# Patient Record
Sex: Male | Born: 1980 | Race: White | Hispanic: No | Marital: Single | Smoking: Current every day smoker
Health system: Southern US, Community
[De-identification: ages and names within clinical notes are randomized; demographics above are authoritative.]

## PROBLEM LIST (undated history)

## (undated) DIAGNOSIS — J9383 Other pneumothorax: Secondary | ICD-10-CM

## (undated) DIAGNOSIS — I10 Essential (primary) hypertension: Secondary | ICD-10-CM

---

## 2004-07-07 ENCOUNTER — Emergency Department (HOSPITAL_COMMUNITY): Admission: EM | Admit: 2004-07-07 | Discharge: 2004-07-07 | Payer: Self-pay | Admitting: Emergency Medicine

## 2008-05-05 ENCOUNTER — Emergency Department (HOSPITAL_COMMUNITY): Admission: EM | Admit: 2008-05-05 | Discharge: 2008-05-06 | Payer: Self-pay | Admitting: Emergency Medicine

## 2008-10-16 ENCOUNTER — Emergency Department (HOSPITAL_COMMUNITY): Admission: EM | Admit: 2008-10-16 | Discharge: 2008-10-17 | Payer: Self-pay | Admitting: Emergency Medicine

## 2010-12-26 LAB — COMPREHENSIVE METABOLIC PANEL
ALT: 19 U/L (ref 0–53)
AST: 26 U/L (ref 0–37)
CO2: 25 mEq/L (ref 19–32)
Chloride: 104 mEq/L (ref 96–112)
GFR calc Af Amer: >60 mL/min (ref >60)
GFR calc non Af Amer: >60 mL/min (ref >60)
Glucose, Bld: 86 mg/dL (ref 70–99)
Sodium: 139 mEq/L (ref 135–145)
Total Bilirubin: 0.6 mg/dL (ref 0.3–1.2)

## 2010-12-26 LAB — DIFFERENTIAL
Basophils Absolute: 0.1 10*3/uL (ref 0.0–0.1)
Basophils Relative: 1 % (ref 0–1)
Eosinophils Absolute: 0.3 10*3/uL (ref 0.0–0.7)
Eosinophils Relative: 3 % (ref 0–5)

## 2010-12-26 LAB — CBC
Hemoglobin: 16.2 g/dL (ref 13.0–17.0)
MCV: 87.3 fL (ref 78.0–100.0)
RBC: 5.37 MIL/uL (ref 4.22–5.81)
WBC: 10.8 10*3/uL — ABNORMAL HIGH (ref 4.0–10.5)

## 2010-12-26 LAB — LIPASE, BLOOD: Lipase: 25 U/L (ref 11–59)

## 2017-05-07 ENCOUNTER — Emergency Department (HOSPITAL_COMMUNITY): Payer: Self-pay

## 2017-05-07 ENCOUNTER — Encounter (HOSPITAL_COMMUNITY): Payer: Self-pay | Admitting: Emergency Medicine

## 2017-05-07 ENCOUNTER — Observation Stay (HOSPITAL_COMMUNITY)
Admission: EM | Admit: 2017-05-07 | Discharge: 2017-05-08 | Disposition: A | Discharge status: Home / Self Care | Payer: Self-pay | Attending: Nephrology | Admitting: Nephrology

## 2017-05-07 ENCOUNTER — Other Ambulatory Visit: Payer: Self-pay

## 2017-05-07 ENCOUNTER — Other Ambulatory Visit (HOSPITAL_COMMUNITY): Payer: Self-pay

## 2017-05-07 DIAGNOSIS — F17210 Nicotine dependence, cigarettes, uncomplicated: Secondary | ICD-10-CM | POA: Insufficient documentation

## 2017-05-07 DIAGNOSIS — I10 Essential (primary) hypertension: Secondary | ICD-10-CM | POA: Insufficient documentation

## 2017-05-07 DIAGNOSIS — Z791 Long term (current) use of non-steroidal anti-inflammatories (NSAID): Secondary | ICD-10-CM | POA: Insufficient documentation

## 2017-05-07 DIAGNOSIS — F17200 Nicotine dependence, unspecified, uncomplicated: Secondary | ICD-10-CM | POA: Diagnosis present

## 2017-05-07 DIAGNOSIS — N179 Acute kidney failure, unspecified: Principal | ICD-10-CM | POA: Diagnosis present

## 2017-05-07 DIAGNOSIS — D751 Secondary polycythemia: Secondary | ICD-10-CM | POA: Insufficient documentation

## 2017-05-07 DIAGNOSIS — R079 Chest pain, unspecified: Secondary | ICD-10-CM | POA: Insufficient documentation

## 2017-05-07 DIAGNOSIS — E860 Dehydration: Secondary | ICD-10-CM | POA: Diagnosis present

## 2017-05-07 HISTORY — DX: Essential (primary) hypertension: I10

## 2017-05-07 HISTORY — DX: Other pneumothorax: J93.83

## 2017-05-07 LAB — CBC
HCT: 51.7 % (ref 39.0–52.0)
HEMOGLOBIN: 18.1 g/dL — AB (ref 13.0–17.0)
MCH: 29.3 pg (ref 26.0–34.0)
MCHC: 35 g/dL (ref 30.0–36.0)
MCV: 83.8 fL (ref 78.0–100.0)
PLATELETS: 292 10*3/uL (ref 150–400)
RBC: 6.17 MIL/uL — ABNORMAL HIGH (ref 4.22–5.81)
RDW: 13.9 % (ref 11.5–15.5)
WBC: 10.2 10*3/uL (ref 4.0–10.5)

## 2017-05-07 LAB — URINALYSIS, ROUTINE W REFLEX MICROSCOPIC
BILIRUBIN URINE: NEGATIVE
GLUCOSE, UA: NEGATIVE mg/dL
HGB URINE DIPSTICK: NEGATIVE
Ketones, ur: NEGATIVE mg/dL
Leukocytes, UA: NEGATIVE
NITRITE: NEGATIVE
PH: 5 (ref 5.0–8.0)
Protein, ur: 30 mg/dL — AB
SPECIFIC GRAVITY, URINE: 1.039 — AB (ref 1.005–1.030)

## 2017-05-07 LAB — BASIC METABOLIC PANEL
Anion gap: 16 — ABNORMAL HIGH (ref 5–15)
BUN: 27 mg/dL — AB (ref 6–20)
CHLORIDE: 97 mmol/L — AB (ref 101–111)
CO2: 24 mmol/L (ref 22–32)
CREATININE: 2.88 mg/dL — AB (ref 0.61–1.24)
Calcium: 10.4 mg/dL — ABNORMAL HIGH (ref 8.9–10.3)
GFR calc Af Amer: 31 mL/min — ABNORMAL LOW (ref >60)
GFR calc non Af Amer: 26 mL/min — ABNORMAL LOW (ref >60)
GLUCOSE: 102 mg/dL — AB (ref 65–99)
Potassium: 4.6 mmol/L (ref 3.5–5.1)
SODIUM: 137 mmol/L (ref 135–145)

## 2017-05-07 LAB — CK: Total CK: 388 U/L (ref 49–397)

## 2017-05-07 LAB — I-STAT TROPONIN, ED: TROPONIN I, POC: 0 ng/mL (ref 0.00–0.08)

## 2017-05-07 MED ORDER — ONDANSETRON HCL 4 MG/2ML IJ SOLN: 4.0000 mg | Freq: Four times a day (QID) | INTRAMUSCULAR | Status: DC | PRN

## 2017-05-07 MED ORDER — MORPHINE SULFATE (PF) 4 MG/ML IV SOLN
4.0000 mg | Freq: Once | INTRAVENOUS | Status: AC
  Administered 2017-05-07: 4 mg via INTRAVENOUS
  Filled 2017-05-07: qty 1

## 2017-05-07 MED ORDER — KETOROLAC TROMETHAMINE 30 MG/ML IJ SOLN
30.0000 mg | Freq: Once | INTRAMUSCULAR | Status: AC
  Administered 2017-05-07: 30 mg via INTRAVENOUS
  Filled 2017-05-07: qty 1

## 2017-05-07 MED ORDER — ENOXAPARIN SODIUM 30 MG/0.3ML ~~LOC~~ SOLN: 30.0000 mg | SUBCUTANEOUS | Status: DC

## 2017-05-07 MED ORDER — SODIUM CHLORIDE 0.45 % IV SOLN
INTRAVENOUS | Status: DC
  Administered 2017-05-08 (×2): via INTRAVENOUS

## 2017-05-07 MED ORDER — IOPAMIDOL (ISOVUE-370) INJECTION 76%
INTRAVENOUS | Status: AC
  Administered 2017-05-07: 100 mL
  Filled 2017-05-07: qty 100

## 2017-05-07 MED ORDER — ACETAMINOPHEN 325 MG PO TABS
650.0000 mg | ORAL_TABLET | Freq: Four times a day (QID) | ORAL | Status: DC | PRN
Start: 2017-05-07 — End: 2017-05-08

## 2017-05-07 MED ORDER — LIDOCAINE 5 % EX PTCH: 1.0000 | MEDICATED_PATCH | CUTANEOUS | Status: DC

## 2017-05-07 MED ORDER — SODIUM CHLORIDE 0.9 % IV BOLUS (SEPSIS)
1000.0000 mL | Freq: Once | INTRAVENOUS | Status: AC
  Administered 2017-05-07: 1000 mL via INTRAVENOUS

## 2017-05-07 MED ORDER — METHOCARBAMOL 500 MG PO TABS: 1000.0000 mg | ORAL_TABLET | Freq: Once | ORAL | Status: DC

## 2017-05-07 MED ORDER — SODIUM CHLORIDE 0.45 % IV BOLUS
1000.0000 mL | Freq: Once | INTRAVENOUS | Status: AC
  Administered 2017-05-07: 1000 mL via INTRAVENOUS

## 2017-05-07 NOTE — ED Notes (Signed)
Main lab to add on CK 

## 2017-05-07 NOTE — H&P (Signed)
History and Physical    [name]:[id number] DOB: Feb 03, 1981 DOA: 05/07/2017  PCP: Patient, No Pcp Per   Patient coming from: home.  I have personally briefly reviewed patient's old medical records in [name]  Chief Complaint: Chest pain and shortness of breath.  HPI: Tony Daniels is a 36 y.o. male with medical history significant of hypertension and is pontine and pneumothoraces coming to the emergency department with complaints of chest pain and progressively worse dyspnea since yesterday. He mentions that the symptoms are similar to when he had a pneumothorax in the past. He also complains of having body aches, fatigue and malaise similar to when he had a history in the past. 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 He mentions that he has had decreased urination for the past 2 days. He denies fever, chills, headache, sore throat, palpitations, dizziness, diaphoresis, lower extremity edema, PND or orthopnea. He denies nausea, emesis, diarrhea, constipation,melena or hematochezia. He denies dysuria, frequency or hematuria, but states that his urine volume has been decreased.  ED Course: Initial vital signs in the emergency department were temperature 97.5F, pulse 127, respirations 18, blood pressure 119/90 mmHg and O2 sat 99% on room air. He received 3 rounds of morphine 4 mg IVP, Toradol 30 mg IVP and 2000 mL of normal saline IV bolus.  Workup in the emergency department shows a urinalysis with increase a specific gravity and mild proteinuria at 30 mg/dL.WBC was 10.2, hemoglobin 18.1 g/dL and platelet is 578. His sodium 137, potassium 4.6, chloride 97and bicarbonate 24 mmol/L.BUN was 27, creatinine 2.88 (0.84 in February 2010) and glucose 102 mg/dL. His total CK was 388.  Imaging: no pulmonary embolus and no pneumothorax noticed. Small paraseptal emphysematous cysts on right apex.  Review of  Systems: As per HPI otherwise 10 point review of systems negative.    Past Medical History:  Diagnosis Date  . Hypertension   . Spontaneous pneumothorax     History reviewed. No pertinent surgical history.   reports that he has been smoking.  He has never used smokeless tobacco. He reports that he does not drink alcohol or use drugs.  Allergies  Allergen Reactions  . Tape Rash    No "plastic, clear tape"   Family History  Problem Relation Age of Onset  . Diabetes Mellitus II Mother   . Hypertension Mother   . Heart disease Paternal Uncle        Multiple paternal uncles with heart disease    Prior to Admission medications   Medication Sig Start Date End Date Taking? Authorizing Provider  ibuprofen (ADVIL,MOTRIN) 200 MG tablet Take 800 mg by mouth every 6 (six) hours as needed (for pain).   Yes [provider]    Physical Exam: Vitals:   05/07/17 2043 05/07/17 2100 05/07/17 2130 05/07/17 2200  BP: 106/77 126/82 127/78 120/83  Pulse: (!) 104 100 (!) 104 95  Resp: 20 12 15  (!) 28  Temp:      TempSrc:      SpO2: 98% 94% 97% 100%  Weight:      Height:        Constitutional: NAD, calm, comfortable Eyes: PERRL, lids and conjunctivae normal ENMT: Mucous membranes are moist. Posterior pharynx clear of any exudate or lesions.Normal dentition.  Neck: normal, supple, no masses, no thyromegaly Respiratory: clear to auscultation bilaterally, no wheezing, no crackles. Normal respiratory effort. No accessory muscle  use. Chest wall: Right lateral lower chest wall tenderness. Cardiovascular: Regular rate and rhythm, no murmurs / rubs / gallops. No extremity edema. 2+ pedal pulses. No carotid bruits.  Abdomen: no tenderness, no masses palpated. No hepatosplenomegaly. Bowel sounds positive.  Musculoskeletal: no clubbing / cyanosis.  Good ROM, no contractures. Normal muscle tone.  Skin: no rashes, lesions, ulcers on limited skin exam. Neurologic: CN 2-12 grossly intact.  Sensation intact, DTR normal. Strength 5/5 in all 4.  Psychiatric: Normal judgment and insight. Alert and oriented x 4. Normal mood.    Labs on Admission: I have personally reviewed following labs and imaging studies  CBC:  Recent Labs Lab 05/07/17 1813  WBC 10.2  HGB 18.1*  HCT 51.7  MCV 83.8  PLT 292   Basic Metabolic Panel:  Recent Labs Lab 05/07/17 1813  NA 137  K 4.6  CL 97*  CO2 24  GLUCOSE 102*  BUN 27*  CREATININE 2.88*  CALCIUM 10.4*   GFR: Estimated Creatinine Clearance: 33.8 mL/min (A) (by C-G formula based on SCr of 2.88 mg/dL (H)). Liver Function Tests: No results for input(s): AST, ALT, ALKPHOS, BILITOT, PROT, ALBUMIN in the last 168 hours. No results for input(s): LIPASE, AMYLASE in the last 168 hours. No results for input(s): AMMONIA in the last 168 hours. Coagulation Profile: No results for input(s): INR, PROTIME in the last 168 hours. Cardiac Enzymes:  Recent Labs Lab 05/07/17 1813  CKTOTAL 388   BNP (last 3 results) No results for input(s): PROBNP in the last 8760 hours. HbA1C: No results for input(s): HGBA1C in the last 72 hours. CBG: No results for input(s): GLUCAP in the last 168 hours. Lipid Profile: No results for input(s): CHOL, HDL, LDLCALC, TRIG, CHOLHDL, LDLDIRECT in the last 72 hours. Thyroid Function Tests: No results for input(s): TSH, T4TOTAL, FREET4, T3FREE, THYROIDAB in the last 72 hours. Anemia Panel: No results for input(s): VITAMINB12, FOLATE, FERRITIN, TIBC, IRON, RETICCTPCT in the last 72 hours. Urine analysis:    Component Value Date/Time   COLORURINE AMBER (A) 05/07/2017 2011   APPEARANCEUR HAZY (A) 05/07/2017 2011   LABSPEC 1.039 (H) 05/07/2017 2011   PHURINE 5.0 05/07/2017 2011   GLUCOSEU NEGATIVE 05/07/2017 2011   HGBUR NEGATIVE 05/07/2017 2011   BILIRUBINUR NEGATIVE 05/07/2017 2011   KETONESUR NEGATIVE 05/07/2017 2011   PROTEINUR 30 (A) 05/07/2017 2011   NITRITE NEGATIVE 05/07/2017 2011   LEUKOCYTESUR  NEGATIVE 05/07/2017 2011    Radiological Exams on Admission: Dg Chest 2 View  Result Date: 05/07/2017 CLINICAL DATA:  36 year old male with right side chest pain and shortness of breath since yesterday. Decreased breath sounds on the right. Smoker. EXAM: CHEST  2 VIEW COMPARISON:  08/23/2016 and earlier. FINDINGS: Stable lung volumes at the upper limits of normal to hyperinflated. Mediastinal contours remain normal. Visualized tracheal air column is within normal limits. No pneumothorax, pulmonary edema, pleural effusion or confluent pulmonary opacity. Stable visualized osseous structures. Chronic mild increased interstitial markings. Negative visible bowel gas pattern. IMPRESSION: No pneumothorax or acute cardiopulmonary abnormality. Electronically Signed   By: Odessa Fleming M.D.   On: 05/07/2017 17:57   Ct Angio Chest Pe W And/or Wo Contrast  Result Date: 05/07/2017 CLINICAL DATA:  Pt c/o severe CP x 2 days.H/O spontaneous pneumothorax EXAM: CT ANGIOGRAPHY CHEST WITH CONTRAST TECHNIQUE: Multidetector CT imaging of the chest was performed using the standard protocol during bolus administration of intravenous contrast. Multiplanar CT image reconstructions and MIPs were obtained to evaluate the vascular anatomy. CONTRAST:  100  mL of Isovue 370 intravenous contrast COMPARISON:  Current chest radiographs FINDINGS: Cardiovascular: Satisfactory opacification of the pulmonary arteries to the segmental level. No evidence of pulmonary embolism. Normal heart size. No pericardial effusion. No visible coronary artery calcifications. Aorta is not opacified. No aneurysm. Mediastinum/Nodes: No enlarged mediastinal, hilar, or axillary lymph nodes. Thyroid gland, trachea, and esophagus demonstrate no significant findings. Lungs/Pleura: Small anastomosis staple line lies along the anteromedial right lung apex. There is an adjacent paraseptal emphysematous cysts. There is no pneumothorax. Single nodule lies in the right upper  lobe, image 72, series 9, measuring 6 x 4 mm, average 5 mm. No other lung nodules. Lungs are otherwise clear. No pleural effusion. Upper Abdomen: Unremarkable. Musculoskeletal: No chest wall abnormality. No acute or significant osseous findings. Review of the MIP images confirms the above findings. IMPRESSION: 1. No evidence of a pulmonary embolism. 2. No acute findings or findings to account for chest pain. No pneumothorax. 3. Small paraseptal emphysematous cysts at the right apex adjacent to a pulmonary anastomosis staple line. 4. 6 x 4 mm, mean 5 mm, nodule in the right upper lobe. No follow-up needed if patient is low-risk. Non-contrast chest CT can be considered in 12 months if patient is high-risk. This recommendation follows the consensus statement: Guidelines for Management of Incidental Pulmonary Nodules Detected on CT Images: From the Fleischner Society 2017; Radiology 2017; 284:228-243. Emphysema (ICD10-J43.9). Electronically Signed   By: Amie Portland M.D.   On: 05/07/2017 18:55    EKG: Independently reviewed. Vent. rate 106 BPM PR interval * ms QRS duration 134 ms QT/QTc 368/489 ms P-R-T axes 77 139 20 Sinus tachycardia Biatrial enlargement RBBB and LPFB ST elev, probable normal early repol pattern  Assessment/Plan Principal Problem:   AKI (acute kidney injury) (HCC) Secondary to dehydration. Place in observation/telemetry. Continue IV fluids. Follow-up renal function and electrolytes in the morning. Check urine creatinine and electrolytes. Check renal ultrasound in the morning. Consider renal evaluation if no improvement.  Active Problems:   Chest pain Continue cardiac monitoring. Analgesics as needed. Trend troponin levels. Check echocardiogram in a.m.    Essential hypertension Not on medical therapy at this time. Will use as needed IV metoprolol. Monitor blood pressure.    Polycythemia Likely due to dehydration. Continue IV fluids. Follow-up hemoglobin level in  the morning.    Dehydration Continue IV fluids. Monitor intake and output. Follow-up BMP in the morning.    Tobacco use disorder Nicotine replacement therapy ordered. Tobacco cessation information/education to be provided. Advised the patient to cease smoking given signs of emphysema on imaging.    DVT prophylaxis: Lovenox SQ. Code Status: full code. Family Communication:  Disposition Plan: admit for IV hydration, cardiac monitoring, troponin levels trending, renal US and echo. Consults called:  Admission status: observation/telemetry.   Bobette Mo MD [facility] Pager [phone].  If 7PM-7AM, please contact night-coverage [email or web address] Password [facility]  05/07/2017, 10:54 PM

## 2017-05-07 NOTE — ED Provider Notes (Signed)
[facility]-EMERGENCY DEPT Provider Note   CSN: [id number] Arrival date & time: 05/07/17  1736     History   Chief Complaint Chief Complaint  Patient presents with  . Chest Pain    HPI Tony Daniels is a 36 y.o. male.  HPI  35 y.o. male with a hx of Spontaneous Pneumothorax, presents to the Emergency Department today due to CP with associated shortness of breath. Notes onset yesterday while working outside. States gradually worsening pain this morning. Pt clutching right side of chest. Pt states that this is where his last pneumothorax was in the past. Seen at [facility] for this. Denies N/V. No fevers. No cough.congestion. No diaphoresis. No abdominal pain. No numbness/tingling. No weakness. Rates pain 10/10 and isolated to right lateral rib cage. Sharp sensation. Worse with breathing. No meds PTA. No other symptoms noted.     Past Medical History:  Diagnosis Date  . Spontaneous pneumothorax     There are no active problems to display for this patient.   History reviewed. No pertinent surgical history.     Home Medications    Prior to Admission medications   Not on File    Family History History reviewed. No pertinent family history.  Social History Social History  Substance Use Topics  . Smoking status: Current Every Day Smoker  . Smokeless tobacco: Never Used  . Alcohol use No     Allergies   Patient has no known allergies.   Review of Systems Review of Systems ROS reviewed and all are negative for acute change except as noted in the HPI.  Physical Exam Updated Vital Signs BP 119/90 (BP Location: Right Arm)   Pulse (!) 127   Temp 97.8 F (36.6 C) (Oral)   Resp 18   SpO2 99%   Physical Exam  Constitutional: He is oriented to person, place, and time. He appears well-developed and well-nourished. No distress.  Pt clutching right lateral rib cage  HENT:  Head: Normocephalic and atraumatic.  Right Ear: Tympanic membrane, external ear and ear canal  normal.  Left Ear: Tympanic membrane, external ear and ear canal normal.  Nose: Nose normal.  Mouth/Throat: Uvula is midline, oropharynx is clear and moist and mucous membranes are normal. No trismus in the jaw. No oropharyngeal exudate, posterior oropharyngeal erythema or tonsillar abscesses.  Eyes: Pupils are equal, round, and reactive to light. EOM are normal.  Neck: Normal range of motion. Neck supple. No tracheal deviation present.  Cardiovascular: Regular rhythm, S1 normal, S2 normal, normal heart sounds, intact distal pulses and normal pulses.  Tachycardia present.   Pulmonary/Chest: Effort normal and breath sounds normal. No respiratory distress. He has no decreased breath sounds. He has no wheezes. He has no rhonchi. He has no rales.  Right lateral chest wall TTP. No palpable or visible deformities.   Abdominal: Normal appearance and bowel sounds are normal. There is no tenderness.  Musculoskeletal: Normal range of motion.  Neurological: He is alert and oriented to person, place, and time.  Skin: Skin is warm and dry.  Psychiatric: He has a normal mood and affect. His speech is normal and behavior is normal. Thought content normal.     ED Treatments / Results  Labs (all labs ordered are listed, but only abnormal results are displayed) Labs Reviewed  CBC - Abnormal; Notable for the following:       Result Value   RBC 6.17 (*)    Hemoglobin 18.1 (*)    All other components within  normal limits  BASIC METABOLIC PANEL - Abnormal; Notable for the following:    Chloride 97 (*)    Glucose, Bld 102 (*)    BUN 27 (*)    Creatinine, Ser 2.88 (*)    Calcium 10.4 (*)    GFR calc non Af Amer 26 (*)    GFR calc Af Amer 31 (*)    Anion gap 16 (*)    All other components within normal limits  URINALYSIS, ROUTINE W REFLEX MICROSCOPIC - Abnormal; Notable for the following:    Color, Urine AMBER (*)    APPearance HAZY (*)    Specific Gravity, Urine 1.039 (*)    Protein, ur 30 (*)     Bacteria, UA RARE (*)    Squamous Epithelial / LPF 0-5 (*)    All other components within normal limits  CK  I-STAT TROPONIN, ED    EKG  EKG Interpretation None       Radiology Dg Chest 2 View  Result Date: 05/07/2017 CLINICAL DATA:  36 year old male with right side chest pain and shortness of breath since yesterday. Decreased breath sounds on the right. Smoker. EXAM: CHEST  2 VIEW COMPARISON:  08/23/2016 and earlier. FINDINGS: Stable lung volumes at the upper limits of normal to hyperinflated. Mediastinal contours remain normal. Visualized tracheal air column is within normal limits. No pneumothorax, pulmonary edema, pleural effusion or confluent pulmonary opacity. Stable visualized osseous structures. Chronic mild increased interstitial markings. Negative visible bowel gas pattern. IMPRESSION: No pneumothorax or acute cardiopulmonary abnormality. Electronically Signed   By: Odessa Fleming M.D.   On: 05/07/2017 17:57   Ct Angio Chest Pe W And/or Wo Contrast  Result Date: 05/07/2017 CLINICAL DATA:  Pt c/o severe CP x 2 days.H/O spontaneous pneumothorax EXAM: CT ANGIOGRAPHY CHEST WITH CONTRAST TECHNIQUE: Multidetector CT imaging of the chest was performed using the standard protocol during bolus administration of intravenous contrast. Multiplanar CT image reconstructions and MIPs were obtained to evaluate the vascular anatomy. CONTRAST:  100 mL of Isovue 370 intravenous contrast COMPARISON:  Current chest radiographs FINDINGS: Cardiovascular: Satisfactory opacification of the pulmonary arteries to the segmental level. No evidence of pulmonary embolism. Normal heart size. No pericardial effusion. No visible coronary artery calcifications. Aorta is not opacified. No aneurysm. Mediastinum/Nodes: No enlarged mediastinal, hilar, or axillary lymph nodes. Thyroid gland, trachea, and esophagus demonstrate no significant findings. Lungs/Pleura: Small anastomosis staple line lies along the anteromedial right lung  apex. There is an adjacent paraseptal emphysematous cysts. There is no pneumothorax. Single nodule lies in the right upper lobe, image 72, series 9, measuring 6 x 4 mm, average 5 mm. No other lung nodules. Lungs are otherwise clear. No pleural effusion. Upper Abdomen: Unremarkable. Musculoskeletal: No chest wall abnormality. No acute or significant osseous findings. Review of the MIP images confirms the above findings. IMPRESSION: 1. No evidence of a pulmonary embolism. 2. No acute findings or findings to account for chest pain. No pneumothorax. 3. Small paraseptal emphysematous cysts at the right apex adjacent to a pulmonary anastomosis staple line. 4. 6 x 4 mm, mean 5 mm, nodule in the right upper lobe. No follow-up needed if patient is low-risk. Non-contrast chest CT can be considered in 12 months if patient is high-risk. This recommendation follows the consensus statement: Guidelines for Management of Incidental Pulmonary Nodules Detected on CT Images: From the Fleischner Society 2017; Radiology 2017; 284:228-243. Emphysema (ICD10-J43.9). Electronically Signed   By: Amie Portland M.D.   On: 05/07/2017 18:55    Procedures  Procedures (including critical care time)  Medications Ordered in ED Medications  sodium chloride 0.9 % bolus 1,000 mL (not administered)  sodium chloride 0.9 % bolus 1,000 mL (not administered)  morphine 4 MG/ML injection 4 mg (not administered)  morphine 4 MG/ML injection 4 mg (4 mg Intravenous Given 05/07/17 1817)  ketorolac (TORADOL) 30 MG/ML injection 30 mg (30 mg Intravenous Given 05/07/17 1817)  iopamidol (ISOVUE-370) 76 % injection (100 mLs  Contrast Given 05/07/17 1831)     Initial Impression / Assessment and Plan / ED Course  I have reviewed the triage vital signs and the nursing notes.  Pertinent labs & imaging results that were available during my care of the patient were reviewed by me and considered in my medical decision making (see chart for details).  Final  Clinical Impressions(s) / ED Diagnoses  {I have reviewed and evaluated the relevant laboratory values. {I have reviewed and evaluated the relevant imaging studies. {I have interpreted the relevant EKG. {I have reviewed the relevant previous healthcare records.  {I obtained HPI from historian. {Patient discussed with supervising physician.  ED Course:  Assessment: Pt is a 36 y.o. male with a hx of Spontaneous Pneumothorax, presents to the Emergency Department today due to CP with associated shortness of breath. Notes onset yesterday while working outside. States gradually worsening pain this morning. Pt clutching right side of chest. Pt states that this is where his last pneumothorax was in the past. Seen at [facility] for this. Denies N/V. No fevers. No cough.congestion. No diaphoresis. No abdominal pain. No numbness/tingling. No weakness. Rates pain 10/10 and isolated to right lateral rib cage. Sharp sensation. Worse with breathing. No meds PTA. On exam, pt in NAD. Nontoxic/nonseptic appearing. VS with tachycardia. Afebrile. Lungs CTA. Heart RRR. Abdomen nontender soft. CXR with pneumothorax. No acute abnormality EKG unremarkable. Trop negative. Concern for PE vs small pneumo. CT Angio ordered, which negative. Given analgesia in ED.   7:21 PM- BMP with elevated creatinine at 2.88. No previous to compare. BUN elevated as well as decrease in GFR. Dehydration causing AKI? Rhabdo possibility. UA ordered as well as CK. Given analgesia in ED as well as fluids 2L fluids. CT was completed prior to result of creatinine.  9:50 PM- UA unremarkable. CK unremarkable. Plan is to Admit due to AKI.   Disposition/Plan:  Admit Pt acknowledges and agrees with plan  Supervising Physician Arby Barrette, MD  Final diagnoses:  AKI (acute kidney injury) [facility])    New Prescriptions New Prescriptions   No medications on file     Wilber Bihari 05/07/17 2158    Arby Barrette, MD 05/08/17 0100

## 2017-05-07 NOTE — ED Triage Notes (Signed)
Pt c/o right sided CP with some SOB; pt sts hx of spontaneous pneumothorax; lung sounds diminished

## 2017-05-08 ENCOUNTER — Observation Stay (HOSPITAL_BASED_OUTPATIENT_CLINIC_OR_DEPARTMENT_OTHER): Payer: Self-pay

## 2017-05-08 ENCOUNTER — Observation Stay (HOSPITAL_COMMUNITY): Payer: Self-pay

## 2017-05-08 ENCOUNTER — Encounter (HOSPITAL_COMMUNITY): Payer: Self-pay | Admitting: Internal Medicine

## 2017-05-08 DIAGNOSIS — F17200 Nicotine dependence, unspecified, uncomplicated: Secondary | ICD-10-CM | POA: Diagnosis present

## 2017-05-08 DIAGNOSIS — I361 Nonrheumatic tricuspid (valve) insufficiency: Secondary | ICD-10-CM

## 2017-05-08 DIAGNOSIS — E860 Dehydration: Secondary | ICD-10-CM

## 2017-05-08 DIAGNOSIS — N179 Acute kidney failure, unspecified: Secondary | ICD-10-CM

## 2017-05-08 DIAGNOSIS — R079 Chest pain, unspecified: Secondary | ICD-10-CM

## 2017-05-08 LAB — ECHOCARDIOGRAM COMPLETE
Height: 70 in
Weight: 2272 oz

## 2017-05-08 LAB — BASIC METABOLIC PANEL
ANION GAP: 4 — AB (ref 5–15)
BUN: 19 mg/dL (ref 6–20)
CALCIUM: 8.2 mg/dL — AB (ref 8.9–10.3)
CO2: 26 mmol/L (ref 22–32)
Chloride: 107 mmol/L (ref 101–111)
Creatinine, Ser: 1.1 mg/dL (ref 0.61–1.24)
GFR calc Af Amer: >60 mL/min (ref >60)
GFR calc non Af Amer: >60 mL/min (ref >60)
GLUCOSE: 85 mg/dL (ref 65–99)
Potassium: 3.9 mmol/L (ref 3.5–5.1)
Sodium: 137 mmol/L (ref 135–145)

## 2017-05-08 LAB — CBC
HEMATOCRIT: 39.4 % (ref 39.0–52.0)
HEMOGLOBIN: 13.2 g/dL (ref 13.0–17.0)
MCH: 28.3 pg (ref 26.0–34.0)
MCHC: 33.8 g/dL (ref 30.0–36.0)
MCV: 83.8 fL (ref 78.0–100.0)
Platelets: 225 10*3/uL (ref 150–400)
RBC: 4.7 MIL/uL (ref 4.22–5.81)
RDW: 14 % (ref 11.5–15.5)
WBC: 8.4 10*3/uL (ref 4.0–10.5)

## 2017-05-08 LAB — TROPONIN I
Troponin I: <0.03 ng/mL (ref <0.03)
Troponin I: <0.03 ng/mL (ref <0.03)

## 2017-05-08 MED ORDER — HYDROCODONE-ACETAMINOPHEN 5-325 MG PO TABS
1.0000 | ORAL_TABLET | ORAL | Status: DC | PRN
  Filled 2017-05-08: qty 2

## 2017-05-08 MED ORDER — HYDROXYZINE HCL 25 MG PO TABS
50.0000 mg | ORAL_TABLET | Freq: Every evening | ORAL | Status: DC | PRN
  Administered 2017-05-08: 50 mg via ORAL
  Filled 2017-05-08: qty 2

## 2017-05-08 MED ORDER — ZOLPIDEM TARTRATE 5 MG PO TABS
5.0000 mg | ORAL_TABLET | Freq: Every evening | ORAL | Status: DC | PRN
  Filled 2017-05-08: qty 1

## 2017-05-08 MED ORDER — MORPHINE SULFATE (PF) 4 MG/ML IV SOLN
4.0000 mg | Freq: Once | INTRAVENOUS | Status: AC
  Administered 2017-05-08: 4 mg via INTRAVENOUS
  Filled 2017-05-08: qty 1

## 2017-05-08 MED ORDER — METHOCARBAMOL 500 MG PO TABS
500.0000 mg | ORAL_TABLET | Freq: Three times a day (TID) | ORAL | Status: DC
  Administered 2017-05-08 (×2): 500 mg via ORAL
  Filled 2017-05-08 (×2): qty 1

## 2017-05-08 MED ORDER — METHOCARBAMOL 500 MG PO TABS: 500.0000 mg | ORAL_TABLET | Freq: Three times a day (TID) | ORAL | 0 refills | Status: AC | PRN

## 2017-05-08 MED ORDER — ACETAMINOPHEN 325 MG PO TABS: 650.0000 mg | ORAL_TABLET | Freq: Four times a day (QID) | ORAL | Status: AC | PRN

## 2017-05-08 MED ORDER — NICOTINE 14 MG/24HR TD PT24
14.0000 mg | MEDICATED_PATCH | Freq: Every day | TRANSDERMAL | Status: DC
  Administered 2017-05-08 (×2): 14 mg via TRANSDERMAL
  Filled 2017-05-08 (×2): qty 1

## 2017-05-08 MED ORDER — OXYCODONE HCL 5 MG PO TABS
5.0000 mg | ORAL_TABLET | ORAL | Status: DC | PRN
  Administered 2017-05-08 (×4): 5 mg via ORAL
  Filled 2017-05-08 (×4): qty 1

## 2017-05-08 MED ORDER — NICOTINE 14 MG/24HR TD PT24: 14.0000 mg | MEDICATED_PATCH | Freq: Every day | TRANSDERMAL | Status: DC

## 2017-05-08 MED ORDER — HYDROCODONE-ACETAMINOPHEN 5-325 MG PO TABS
1.0000 | ORAL_TABLET | Freq: Once | ORAL | Status: AC
  Administered 2017-05-08: 2 via ORAL
  Filled 2017-05-08: qty 2

## 2017-05-08 NOTE — Progress Notes (Signed)
Financial Counselor to see patient to determine what he will qualify for - pt listed as self pay. Abelino DerrickB Mykael Trott [facility] IncRN,MHA,BSN [phone]

## 2017-05-08 NOTE — Progress Notes (Signed)
  Echocardiogram 2D Echocardiogram has been performed.  Tony Daniels 05/08/2017, 9:35 AM

## 2017-05-08 NOTE — Progress Notes (Signed)
Pt has orders to be discharged. Discharge instructions given and pt has no additional questions at this time. Medication regimen reviewed and pt educated. Pt verbalized understanding and has no additional questions. Telemetry box removed. IV removed and site in good condition. Pt stable and waiting for transportation. 

## 2017-05-08 NOTE — Discharge Summary (Signed)
Physician Discharge Summary  Tony Daniels ZOX:[id number] DOB: 06-18-1981 DOA: 05/07/2017  PCP: Patient, No Pcp Per  Admit date: 05/07/2017 Discharge date: 05/08/2017  Admitted From:home Disposition:home  Recommendations for Outpatient Follow-up:  1. Follow up with PCP in 1-2 weeks 2. Please obtain BMP/CBC in one week   Home Health:no Equipment/Devices:no Discharge Condition:stable CODE STATUS:full code Diet recommendation:heart healthy  Brief/Interim Summary: 36 year old male with history of hypertension, spontaneous pneumothoraxes presented with chest pain or shortness of breath. Chest x-ray was unremarkable. CT angiogram was negative for PE. Patient was found to have right upper lobe lung nodule. No bony abnormalities. Troponin negative. Echocardiogram unremarkable. Patient's chest pain most likely musculoskeletal related causing muscle spasm. He started on Robaxin and recommended Tylenol as needed for the pain management. Patient also found to have hemoconcentration and neck acute kidney injury likely dehydration. Treated with IV fluid with improvement in serum creatinine level and blood counts. Patient was recommended to continue muscle relaxant and Tylenol for the pain management. Recommended to follow up with PCP. Also recommended to follow up with PCP for the evaluation of lung nodule. He verbalized understanding. He has no headache, dizziness, nausea vomiting, shortness of breath. I think patient is medically stable to transition his care to outpatient. Most of the workup are unremarkable.  Discharge Diagnoses:  Principal Problem:   AKI (acute kidney injury) (HCC) Active Problems:   Chest pain   Essential hypertension   Polycythemia   Dehydration   Tobacco use disorder    Discharge Instructions  Discharge Instructions    Call MD for:  difficulty breathing, headache or visual disturbances    Complete by:  As directed    Call MD for:  extreme fatigue    Complete by:  As  directed    Call MD for:  hives    Complete by:  As directed    Call MD for:  persistant dizziness or light-headedness    Complete by:  As directed    Call MD for:  persistant nausea and vomiting    Complete by:  As directed    Call MD for:  severe uncontrolled pain    Complete by:  As directed    Call MD for:  temperature >100.4    Complete by:  As directed    Diet - low sodium heart healthy    Complete by:  As directed    Increase activity slowly    Complete by:  As directed    Increase activity slowly    Complete by:  As directed    You have 6 x 4 mm, mean 5 mm, nodule in the upper lobe of right lung. Non-contrast chest CT can be considered in 12 months, please follow with your PCP.     Allergies as of 05/08/2017      Reactions   Tape Rash   No "plastic, clear tape"      Medication List    STOP taking these medications   ibuprofen 200 MG tablet Commonly known as:  ADVIL,MOTRIN     TAKE these medications   acetaminophen 325 MG tablet Commonly known as:  TYLENOL Take 2 tablets (650 mg total) by mouth every 6 (six) hours as needed for mild pain or headache.   methocarbamol 500 MG tablet Commonly known as:  ROBAXIN Take 1 tablet (500 mg total) by mouth every 8 (eight) hours as needed for muscle spasms.            Discharge Care Instructions  Start     Ordered   05/08/17 0000  acetaminophen (TYLENOL) 325 MG tablet  Every 6 hours PRN     05/08/17 1203   05/08/17 0000  methocarbamol (ROBAXIN) 500 MG tablet  Every 8 hours PRN     05/08/17 1203   05/08/17 0000  Increase activity slowly     05/08/17 1203   05/08/17 0000  Diet - low sodium heart healthy     05/08/17 1203   05/08/17 0000  Call MD for:  temperature >100.4     05/08/17 1203   05/08/17 0000  Call MD for:  persistant nausea and vomiting     05/08/17 1203   05/08/17 0000  Call MD for:  severe uncontrolled pain     05/08/17 1203   05/08/17 0000  Call MD for:  difficulty breathing, headache or  visual disturbances     05/08/17 1203   05/08/17 0000  Call MD for:  hives     05/08/17 1203   05/08/17 0000  Call MD for:  persistant dizziness or light-headedness     05/08/17 1203   05/08/17 0000  Call MD for:  extreme fatigue     05/08/17 1203   05/08/17 0000  Increase activity slowly    Comments:  You have 6 x 4 mm, mean 5 mm, nodule in the upper lobe of right lung. Non-contrast chest CT can be considered in 12 months, please follow with your PCP.   05/08/17 1205     Follow-up Information    [facility]. Schedule an appointment as soon as possible for a visit in 2 week(s).   Contact information: [address] [place] [address] [phone]         Allergies  Allergen Reactions  . Tape Rash    No "plastic, clear tape"    Consultations: None  Procedures/Studies: Echo  Subjective: Seen and examined at bedside. Reports mild right-sided pain for which is started Robaxin and Tylenol as needed. Denied headache, dizziness, nausea vomiting.  Discharge Exam: Vitals:   05/08/17 0744 05/08/17 1146  BP:  (!) 109/54  Pulse: 71 (!) 52  Resp:  20  Temp:  97.8 F (36.6 C)  SpO2:  97%   Vitals:   05/08/17 0533 05/08/17 0611 05/08/17 0744 05/08/17 1146  BP:  (!) 100/53  (!) 109/54  Pulse: (!) 41 (!) 53 71 (!) 52  Resp:  20  20  Temp:  97.7 F (36.5 C)  97.8 F (36.6 C)  TempSrc:  Oral  Oral  SpO2:  100%  97%  Weight:      Height:        General: Pt is alert, awake, not in acute distress Cardiovascular: RRR, S1/S2 +, no rubs, no gallops Respiratory: CTA bilaterally, no wheezing, no rhonchi. No bruises, rashes or injury on the right side of chest Abdominal: Soft, NT, ND, bowel sounds + Extremities: no edema, no cyanosis    The results of significant diagnostics from this hospitalization (including imaging, microbiology, ancillary and laboratory) are listed below for reference.     Microbiology: No results  found for this or any previous visit (from the past 240 hour(s)).   Labs: BNP (last 3 results) No results for input(s): BNP in the last 8760 hours. Basic Metabolic Panel:  Recent Labs Lab 05/07/17 1813 05/08/17 0955  NA 137 137  K 4.6 3.9  CL 97* 107  CO2 24 26  GLUCOSE 102* 85  BUN 27* 19  CREATININE 2.88* 1.10  CALCIUM 10.4* 8.2*   Liver Function Tests: No results for input(s): AST, ALT, ALKPHOS, BILITOT, PROT, ALBUMIN in the last 168 hours. No results for input(s): LIPASE, AMYLASE in the last 168 hours. No results for input(s): AMMONIA in the last 168 hours. CBC:  Recent Labs Lab 05/07/17 1813 05/08/17 0642  WBC 10.2 8.4  HGB 18.1* 13.2  HCT 51.7 39.4  MCV 83.8 83.8  PLT 292 225   Cardiac Enzymes:  Recent Labs Lab 05/07/17 1813 05/08/17 0044 05/08/17 0955  CKTOTAL 388  --   --   TROPONINI  --  <0.03 <0.03   BNP: Invalid input(s): POCBNP CBG: No results for input(s): GLUCAP in the last 168 hours. D-Dimer No results for input(s): DDIMER in the last 72 hours. Hgb A1c No results for input(s): HGBA1C in the last 72 hours. Lipid Profile No results for input(s): CHOL, HDL, LDLCALC, TRIG, CHOLHDL, LDLDIRECT in the last 72 hours. Thyroid function studies No results for input(s): TSH, T4TOTAL, T3FREE, THYROIDAB in the last 72 hours.  Invalid input(s): FREET3 Anemia work up No results for input(s): VITAMINB12, FOLATE, FERRITIN, TIBC, IRON, RETICCTPCT in the last 72 hours. Urinalysis    Component Value Date/Time   COLORURINE AMBER (A) 05/07/2017 2011   APPEARANCEUR HAZY (A) 05/07/2017 2011   LABSPEC 1.039 (H) 05/07/2017 2011   PHURINE 5.0 05/07/2017 2011   GLUCOSEU NEGATIVE 05/07/2017 2011   HGBUR NEGATIVE 05/07/2017 2011   BILIRUBINUR NEGATIVE 05/07/2017 2011   KETONESUR NEGATIVE 05/07/2017 2011   PROTEINUR 30 (A) 05/07/2017 2011   NITRITE NEGATIVE 05/07/2017 2011   LEUKOCYTESUR NEGATIVE 05/07/2017 2011   Sepsis Labs Invalid input(s):  PROCALCITONIN,  WBC,  LACTICIDVEN Microbiology No results found for this or any previous visit (from the past 240 hour(s)).   Time coordinating discharge: 28 minutes  SIGNED:   Maxie Barb, MD  [facility] 05/08/2017, 12:06 PM  If 7PM-7AM, please contact night-coverage [email or web address] Password [email or web address]

## 2017-06-28 ENCOUNTER — Emergency Department (HOSPITAL_COMMUNITY): Admission: EM | Admit: 2017-06-28 | Discharge: 2017-06-28 | Discharge status: Against Medical Advice | Payer: Self-pay

## 2017-06-28 NOTE — ED Notes (Signed)
Called pt. X1  Pt. Is not in the waiting area.

## 2017-06-28 NOTE — ED Notes (Signed)
Called pt. X2 in waiting area, no answer

## 2018-08-13 DIAGNOSIS — E872 Acidosis: Secondary | ICD-10-CM

## 2018-08-13 DIAGNOSIS — J449 Chronic obstructive pulmonary disease, unspecified: Secondary | ICD-10-CM

## 2018-08-13 DIAGNOSIS — A419 Sepsis, unspecified organism: Secondary | ICD-10-CM

## 2018-08-13 DIAGNOSIS — J189 Pneumonia, unspecified organism: Secondary | ICD-10-CM

## 2019-02-08 IMAGING — US US RENAL
1 series · 14 of 25 positions shown · non-contrast
Comparison: CT 10/16/2008.

CLINICAL DATA: Acute renal insufficiency.

EXAM:
RENAL / URINARY TRACT ULTRASOUND COMPLETE

[Series 1: us renal · 0.23mm/px · 14 of 36 slices shown]
[im 1/36]
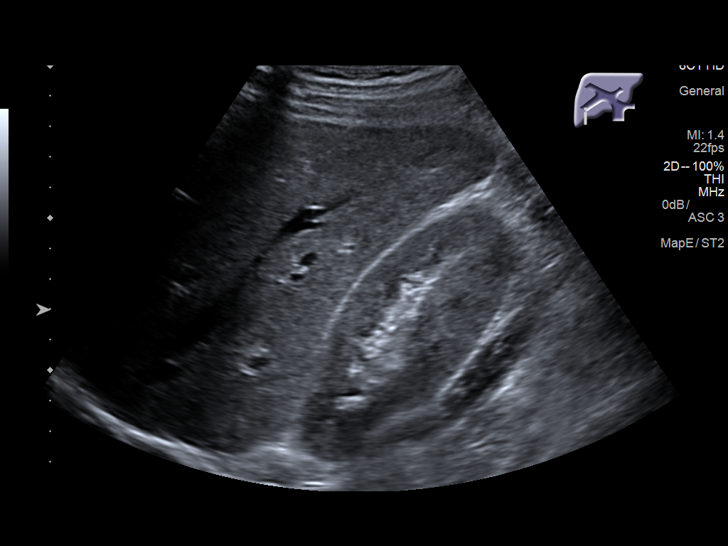
[im 3/36]
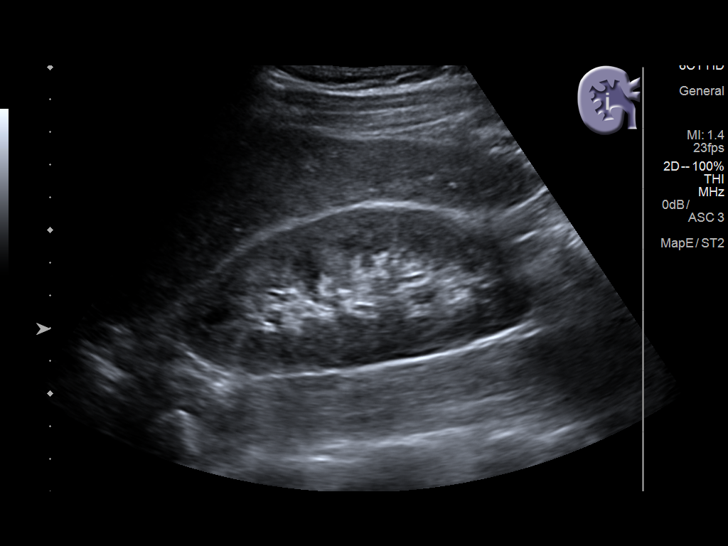
[im 6/36]
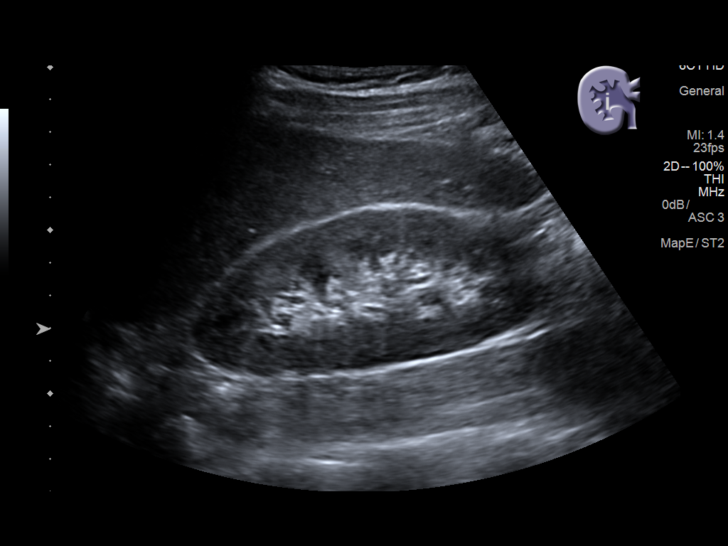
[im 9/36]
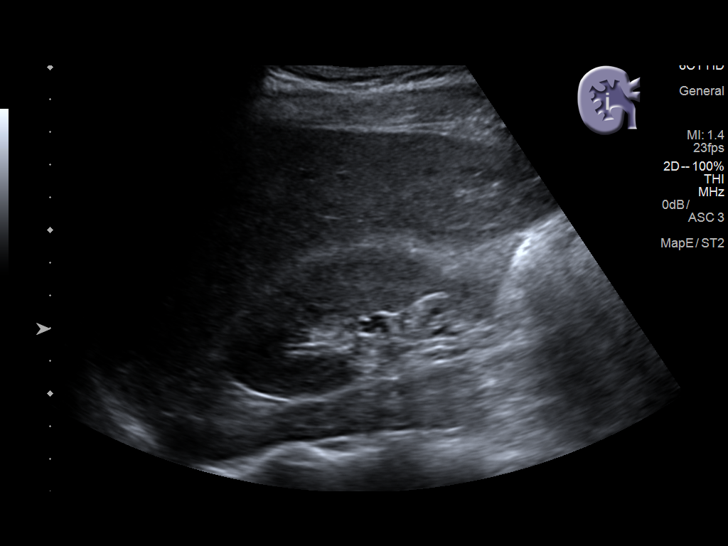
[im 12/36]
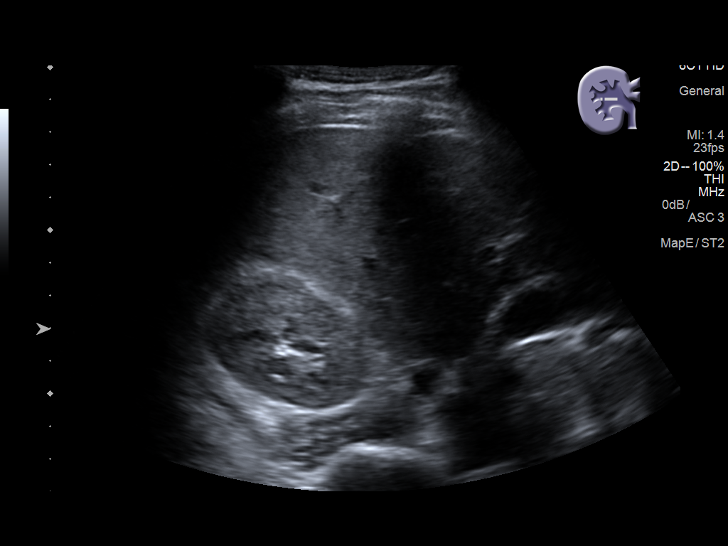
[im 14/36]
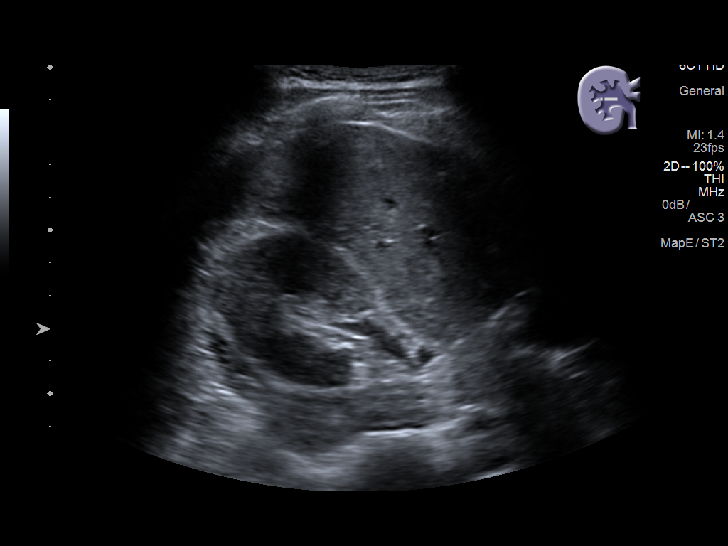
[im 17/36]
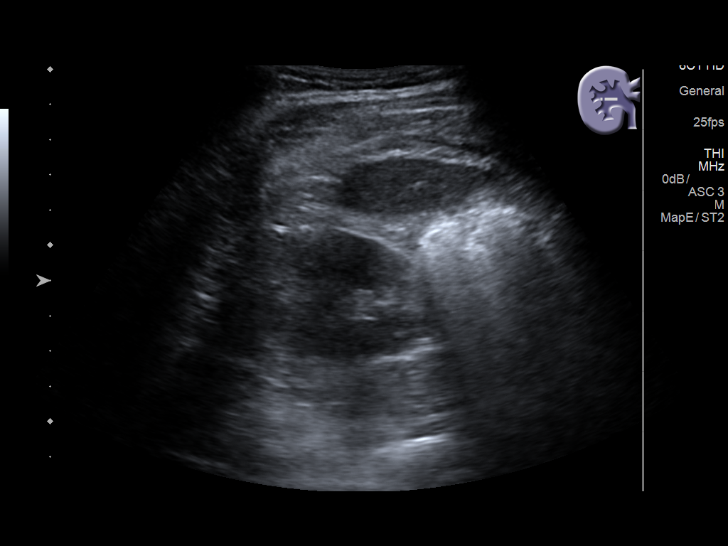
[im 19/36]
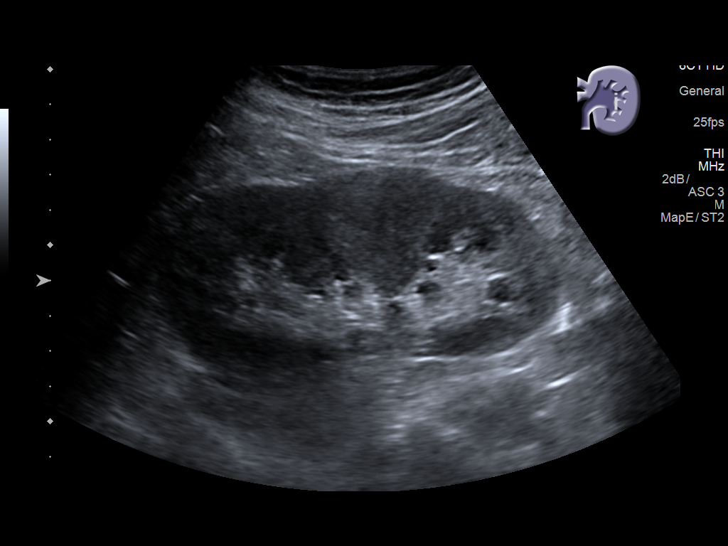
[im 22/36]
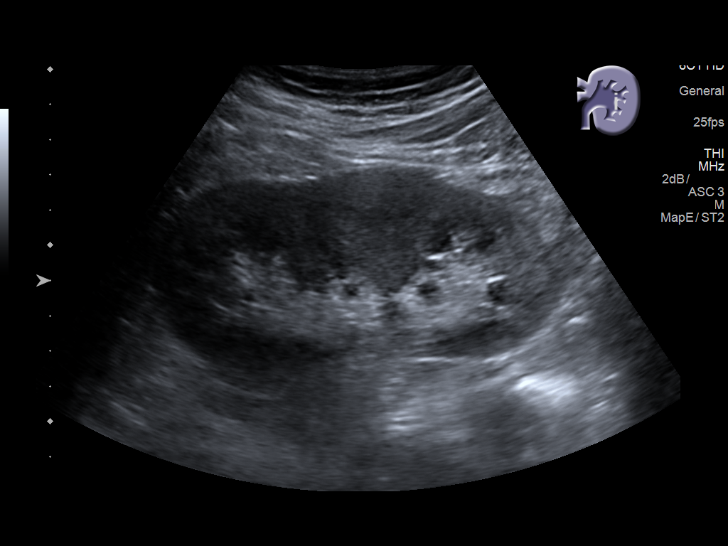
[im 24/36]
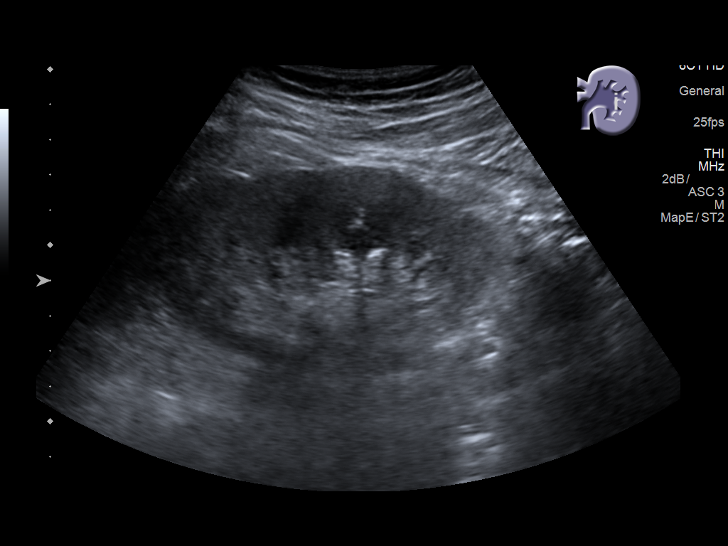
[im 27/36]
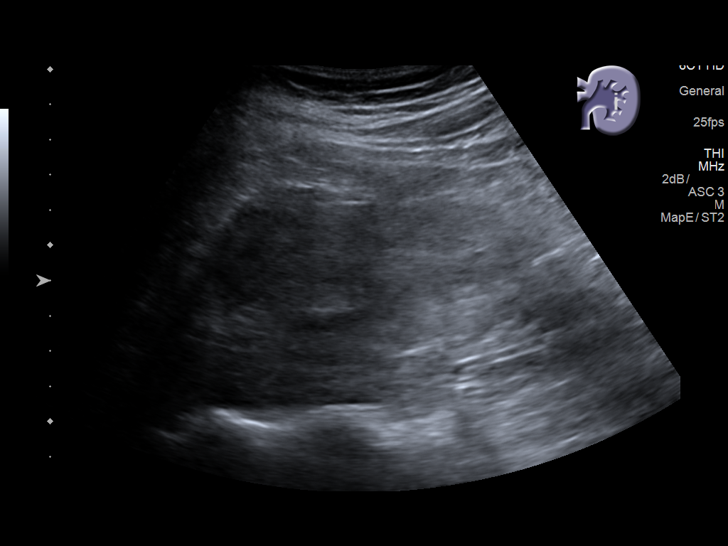
[im 30/36]
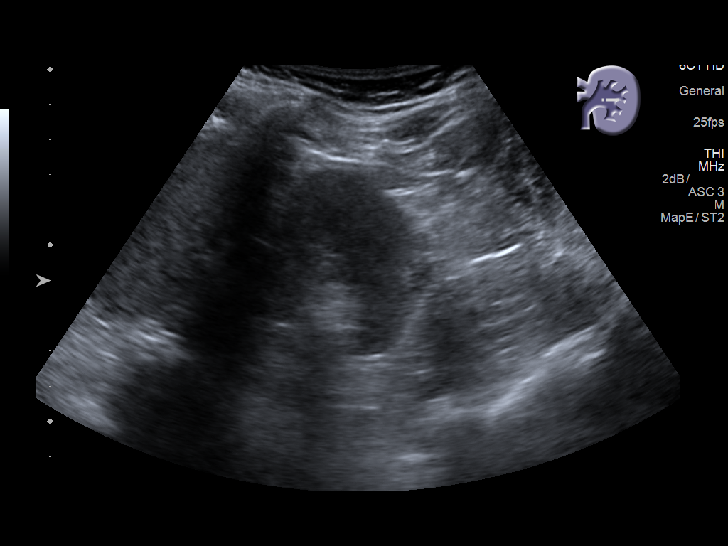
[im 33/36]
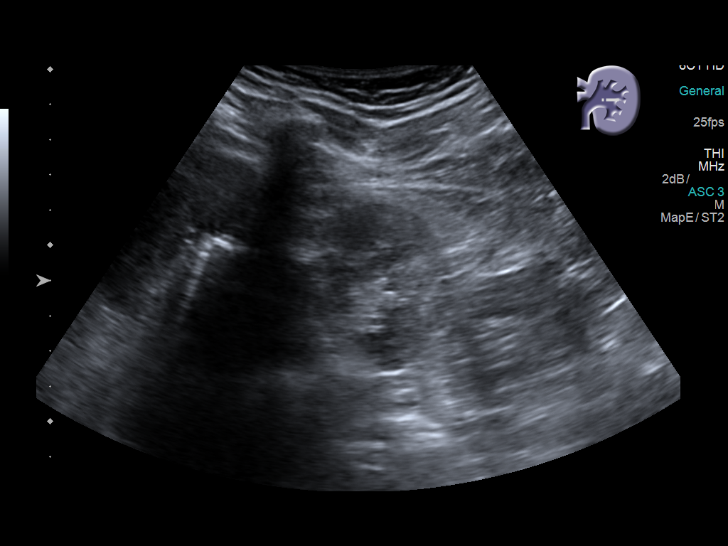
[im 36/36]
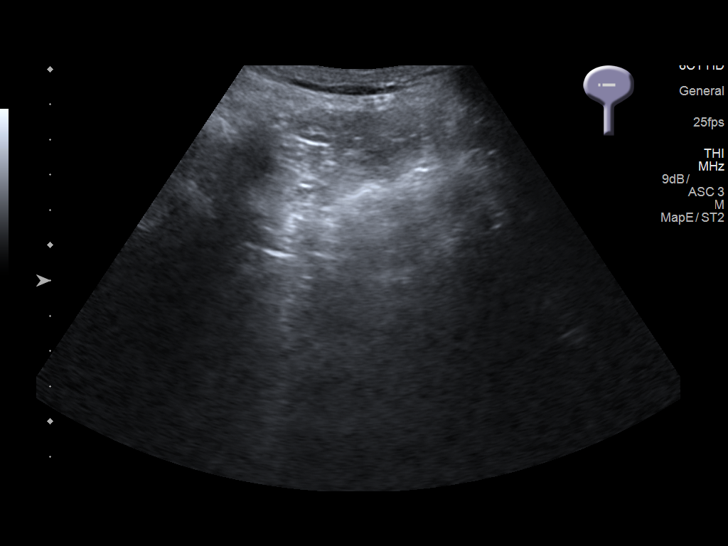

[14 of 25 positions shown; findings below may reference images not displayed]

FINDINGS: Right Kidney:

Length: 10.9 cm. Echogenicity within normal limits. No mass or
hydronephrosis visualized.

Left Kidney:

Length: 11.6 cm. Echogenicity within normal limits. No mass or
hydronephrosis visualized.

Bladder:

Appears normal for degree of bladder distention.
IMPRESSION: No acute or focal abnormality identified.

## 2022-12-25 DIAGNOSIS — F1110 Opioid abuse, uncomplicated: Secondary | ICD-10-CM | POA: Diagnosis not present

## 2022-12-25 DIAGNOSIS — R404 Transient alteration of awareness: Secondary | ICD-10-CM | POA: Diagnosis not present

## 2022-12-25 DIAGNOSIS — T50901A Poisoning by unspecified drugs, medicaments and biological substances, accidental (unintentional), initial encounter: Secondary | ICD-10-CM | POA: Diagnosis not present

## 2022-12-25 DIAGNOSIS — R55 Syncope and collapse: Secondary | ICD-10-CM | POA: Diagnosis not present

## 2022-12-25 DIAGNOSIS — J9600 Acute respiratory failure, unspecified whether with hypoxia or hypercapnia: Secondary | ICD-10-CM | POA: Diagnosis not present

## 2022-12-25 DIAGNOSIS — R0902 Hypoxemia: Secondary | ICD-10-CM | POA: Diagnosis not present

## 2022-12-25 DIAGNOSIS — T50904A Poisoning by unspecified drugs, medicaments and biological substances, undetermined, initial encounter: Secondary | ICD-10-CM | POA: Diagnosis not present

## 2023-01-02 DIAGNOSIS — R404 Transient alteration of awareness: Secondary | ICD-10-CM | POA: Diagnosis not present

## 2023-01-02 DIAGNOSIS — T50904A Poisoning by unspecified drugs, medicaments and biological substances, undetermined, initial encounter: Secondary | ICD-10-CM | POA: Diagnosis not present

## 2023-01-02 DIAGNOSIS — I4510 Unspecified right bundle-branch block: Secondary | ICD-10-CM | POA: Diagnosis not present

## 2023-01-02 DIAGNOSIS — R0681 Apnea, not elsewhere classified: Secondary | ICD-10-CM | POA: Diagnosis not present

## 2023-01-03 DIAGNOSIS — F1110 Opioid abuse, uncomplicated: Secondary | ICD-10-CM | POA: Diagnosis not present

## 2023-01-03 DIAGNOSIS — T50901A Poisoning by unspecified drugs, medicaments and biological substances, accidental (unintentional), initial encounter: Secondary | ICD-10-CM | POA: Diagnosis not present

## 2023-01-03 DIAGNOSIS — I10 Essential (primary) hypertension: Secondary | ICD-10-CM | POA: Diagnosis not present

## 2023-01-30 DIAGNOSIS — R55 Syncope and collapse: Secondary | ICD-10-CM | POA: Diagnosis not present

## 2023-01-30 DIAGNOSIS — R404 Transient alteration of awareness: Secondary | ICD-10-CM | POA: Diagnosis not present

## 2023-01-30 DIAGNOSIS — T50901A Poisoning by unspecified drugs, medicaments and biological substances, accidental (unintentional), initial encounter: Secondary | ICD-10-CM | POA: Diagnosis not present

## 2023-01-30 DIAGNOSIS — T50904A Poisoning by unspecified drugs, medicaments and biological substances, undetermined, initial encounter: Secondary | ICD-10-CM | POA: Diagnosis not present

## 2023-01-30 DIAGNOSIS — R0602 Shortness of breath: Secondary | ICD-10-CM | POA: Diagnosis not present

## 2023-01-30 DIAGNOSIS — R739 Hyperglycemia, unspecified: Secondary | ICD-10-CM | POA: Diagnosis not present

## 2023-02-18 DIAGNOSIS — R55 Syncope and collapse: Secondary | ICD-10-CM | POA: Diagnosis not present

## 2023-02-18 DIAGNOSIS — R404 Transient alteration of awareness: Secondary | ICD-10-CM | POA: Diagnosis not present

## 2023-02-18 DIAGNOSIS — I4510 Unspecified right bundle-branch block: Secondary | ICD-10-CM | POA: Diagnosis not present

## 2023-02-18 DIAGNOSIS — T50904A Poisoning by unspecified drugs, medicaments and biological substances, undetermined, initial encounter: Secondary | ICD-10-CM | POA: Diagnosis not present

## 2023-02-18 DIAGNOSIS — T40601A Poisoning by unspecified narcotics, accidental (unintentional), initial encounter: Secondary | ICD-10-CM | POA: Diagnosis not present

## 2023-09-24 DIAGNOSIS — R000 Tachycardia, unspecified: Secondary | ICD-10-CM | POA: Diagnosis not present

## 2023-09-24 DIAGNOSIS — T887XXA Unspecified adverse effect of drug or medicament, initial encounter: Secondary | ICD-10-CM | POA: Diagnosis not present

## 2023-09-24 DIAGNOSIS — R45851 Suicidal ideations: Secondary | ICD-10-CM | POA: Diagnosis not present

## 2023-09-24 DIAGNOSIS — I4510 Unspecified right bundle-branch block: Secondary | ICD-10-CM | POA: Diagnosis not present

## 2023-09-24 DIAGNOSIS — Z5321 Procedure and treatment not carried out due to patient leaving prior to being seen by health care provider: Secondary | ICD-10-CM | POA: Diagnosis not present

## 2023-09-24 DIAGNOSIS — T50904A Poisoning by unspecified drugs, medicaments and biological substances, undetermined, initial encounter: Secondary | ICD-10-CM | POA: Diagnosis not present

## 2023-09-24 DIAGNOSIS — R42 Dizziness and giddiness: Secondary | ICD-10-CM | POA: Diagnosis not present

## 2023-10-06 DIAGNOSIS — R000 Tachycardia, unspecified: Secondary | ICD-10-CM | POA: Diagnosis not present

## 2023-10-06 DIAGNOSIS — J449 Chronic obstructive pulmonary disease, unspecified: Secondary | ICD-10-CM | POA: Diagnosis not present

## 2023-10-06 DIAGNOSIS — F17210 Nicotine dependence, cigarettes, uncomplicated: Secondary | ICD-10-CM | POA: Diagnosis not present

## 2023-10-06 DIAGNOSIS — R079 Chest pain, unspecified: Secondary | ICD-10-CM | POA: Diagnosis not present

## 2023-10-06 DIAGNOSIS — R918 Other nonspecific abnormal finding of lung field: Secondary | ICD-10-CM | POA: Diagnosis not present

## 2023-10-06 DIAGNOSIS — R0602 Shortness of breath: Secondary | ICD-10-CM | POA: Diagnosis not present

## 2023-10-06 DIAGNOSIS — F1910 Other psychoactive substance abuse, uncomplicated: Secondary | ICD-10-CM | POA: Diagnosis not present

## 2023-10-06 DIAGNOSIS — I10 Essential (primary) hypertension: Secondary | ICD-10-CM | POA: Diagnosis not present

## 2023-10-06 DIAGNOSIS — J101 Influenza due to other identified influenza virus with other respiratory manifestations: Secondary | ICD-10-CM | POA: Diagnosis not present

## 2023-10-06 DIAGNOSIS — R059 Cough, unspecified: Secondary | ICD-10-CM | POA: Diagnosis not present
